# Patient Record
Sex: Male | Born: 1995 | Race: White | Hispanic: No | Marital: Single | Smoking: Never smoker
Health system: Southern US, Community
[De-identification: ages and names within clinical notes are randomized; demographics above are authoritative.]

## PROBLEM LIST (undated history)

## (undated) DIAGNOSIS — J45909 Unspecified asthma, uncomplicated: Secondary | ICD-10-CM

## (undated) DIAGNOSIS — T7840XA Allergy, unspecified, initial encounter: Secondary | ICD-10-CM

## (undated) HISTORY — DX: Unspecified asthma, uncomplicated: J45.909

## (undated) HISTORY — DX: Allergy, unspecified, initial encounter: T78.40XA

---
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
## 2016-02-16 ENCOUNTER — Encounter (HOSPITAL_COMMUNITY): Payer: Self-pay | Admitting: Emergency Medicine

## 2016-02-16 DIAGNOSIS — R1010 Upper abdominal pain, unspecified: Secondary | ICD-10-CM | POA: Insufficient documentation

## 2016-02-16 DIAGNOSIS — Z79899 Other long term (current) drug therapy: Secondary | ICD-10-CM | POA: Diagnosis not present

## 2016-02-16 DIAGNOSIS — R197 Diarrhea, unspecified: Secondary | ICD-10-CM | POA: Diagnosis present

## 2016-02-16 DIAGNOSIS — J45909 Unspecified asthma, uncomplicated: Secondary | ICD-10-CM | POA: Diagnosis not present

## 2016-02-16 DIAGNOSIS — E860 Dehydration: Secondary | ICD-10-CM | POA: Insufficient documentation

## 2016-02-16 LAB — CBC
HEMATOCRIT: 49.1 % (ref 39.0–52.0)
HEMOGLOBIN: 17.4 g/dL — AB (ref 13.0–17.0)
MCH: 33.4 pg (ref 26.0–34.0)
MCHC: 35.4 g/dL (ref 30.0–36.0)
MCV: 94.2 fL (ref 78.0–100.0)
PLATELETS: 286 10*3/uL (ref 150–400)
RBC: 5.21 MIL/uL (ref 4.22–5.81)
RDW: 12.7 % (ref 11.5–15.5)
WBC: 12.8 10*3/uL — AB (ref 4.0–10.5)

## 2016-02-16 MED ORDER — ONDANSETRON 4 MG PO TBDP
4.0000 mg | ORAL_TABLET | Freq: Once | ORAL | Status: AC | PRN
  Administered 2016-02-16: 4 mg via ORAL
  Filled 2016-02-16: qty 1

## 2016-02-16 MED ORDER — ONDANSETRON HCL 4 MG PO TABS
ORAL_TABLET | ORAL | Status: AC
  Filled 2016-02-16: qty 1

## 2016-02-16 NOTE — ED Triage Notes (Signed)
N/v/d and epigastric pain started today

## 2016-02-17 ENCOUNTER — Emergency Department (HOSPITAL_COMMUNITY)
Admission: EM | Admit: 2016-02-17 | Discharge: 2016-02-17 | Disposition: A | Discharge status: Home / Self Care | Payer: BLUE CROSS/BLUE SHIELD | Attending: Emergency Medicine | Admitting: Emergency Medicine

## 2016-02-17 DIAGNOSIS — R197 Diarrhea, unspecified: Secondary | ICD-10-CM

## 2016-02-17 DIAGNOSIS — E860 Dehydration: Secondary | ICD-10-CM

## 2016-02-17 DIAGNOSIS — R112 Nausea with vomiting, unspecified: Secondary | ICD-10-CM

## 2016-02-17 LAB — COMPREHENSIVE METABOLIC PANEL
ALK PHOS: 52 U/L (ref 38–126)
ALT: 19 U/L (ref 17–63)
AST: 21 U/L (ref 15–41)
Albumin: 4.9 g/dL (ref 3.5–5.0)
Anion gap: 10 (ref 5–15)
BILIRUBIN TOTAL: 1.3 mg/dL — AB (ref 0.3–1.2)
BUN: 17 mg/dL (ref 6–20)
CALCIUM: 9.3 mg/dL (ref 8.9–10.3)
CHLORIDE: 103 mmol/L (ref 101–111)
CO2: 23 mmol/L (ref 22–32)
CREATININE: 0.9 mg/dL (ref 0.61–1.24)
Glucose, Bld: 111 mg/dL — ABNORMAL HIGH (ref 65–99)
Potassium: 3.4 mmol/L — ABNORMAL LOW (ref 3.5–5.1)
Sodium: 136 mmol/L (ref 135–145)
TOTAL PROTEIN: 8.3 g/dL — AB (ref 6.5–8.1)

## 2016-02-17 LAB — LIPASE, BLOOD: LIPASE: 16 U/L (ref 11–51)

## 2016-02-17 LAB — URINALYSIS, ROUTINE W REFLEX MICROSCOPIC
BILIRUBIN URINE: NEGATIVE
GLUCOSE, UA: NEGATIVE mg/dL
HGB URINE DIPSTICK: NEGATIVE
Ketones, ur: >80 mg/dL — AB
Leukocytes, UA: NEGATIVE
Nitrite: NEGATIVE
PH: 6 (ref 5.0–8.0)
Protein, ur: NEGATIVE mg/dL
SPECIFIC GRAVITY, URINE: 1.02 (ref 1.005–1.030)

## 2016-02-17 MED ORDER — SODIUM CHLORIDE 0.9 % IV BOLUS (SEPSIS): 1000.0000 mL | Freq: Once | INTRAVENOUS | Status: DC

## 2016-02-17 MED ORDER — SODIUM CHLORIDE 0.9 % IV BOLUS (SEPSIS)
1000.0000 mL | Freq: Once | INTRAVENOUS | Status: AC
  Administered 2016-02-17: 1000 mL via INTRAVENOUS

## 2016-02-17 MED ORDER — ONDANSETRON 4 MG PO TBDP: 4.0000 mg | ORAL_TABLET | Freq: Three times a day (TID) | ORAL | 0 refills | Status: DC | PRN

## 2016-02-17 NOTE — Discharge Instructions (Signed)
Drink plenty of fluids (clear liquids) the next 12-24 hours then start a bland diet such as toast, crackers, jello, [employer] chicken noodle soup. Use the zofran for nausea or vomiting. Take imodium OTC for diarrhea. Avoid mild products until the diarrhea is gone. Recheck if you get worse again

## 2016-02-17 NOTE — ED Notes (Signed)
Pt tolerated fluid challenge with no vomiting reported or observed.

## 2016-02-17 NOTE — ED Notes (Signed)
Pt reports urinating moderate amount, yellow urine.

## 2016-02-17 NOTE — ED Provider Notes (Signed)
[facility]Y DEPT Provider Note   CSN: [id number] Arrival date & time: 02/16/16  2213  Time seen 03:33 AM   History   Chief Complaint Chief Complaint  Patient presents with  . Abdominal Pain    HPI Thomas Pollard is a [age] y.o. male.  HPI  patient states about 10 AM he had a "weird" feeling any stomach. He denies any pain or nausea. He states about noon he started having a epigastric aching sensation. About 5:30 PM he stated start having vomiting. He states he's vomited 5 times and had diarrhea once. He states what happens is he starts getting abdominal pain which he describes as cramping and it continues to get worse and then he has vomiting and the pain goes away until the cycle starts again. He denies any nausea now. He denies feeling dizziness or lightheadedness. Mother states she checked his temperature several times and his temperature varied from 100.4 up to 100.9. She states he was complaining of body aches. He denies sore throat, rhinorrhea, coughing. He has not been around anybody who is ill he is aware of.  PCP Dr Gerda DissLuking  Past Medical History:  Diagnosis Date  . Allergy   . Asthma     Patient Active Problem List   Diagnosis Date Noted  . Asthma, chronic 05/10/2014    History reviewed. No pertinent surgical history.     Home Medications    Prior to Admission medications   Medication Sig Start Date End Date Taking? Authorizing Provider  albuterol (PROVENTIL HFA;VENTOLIN HFA) 108 (90 BASE) MCG/ACT inhaler Inhale 2 puffs into the lungs every 6 (six) hours as needed for wheezing.    Historical Provider, MD  albuterol (PROVENTIL) (2.5 MG/3ML) 0.083% nebulizer solution USE 1 VIAL IN NEBULIZER EVERY 4-6 HOURS AS NEEDED. 12/21/12   Merlyn AlbertWilliam S Luking, MD  HYDROcodone-acetaminophen (NORCO/VICODIN) 5-325 MG per tablet Take 1 tablet by mouth every 4 (four) hours as needed for moderate pain. 05/24/13   Burgess AmorJulie Idol, PA-C  loratadine (CLARITIN) 10 MG tablet Take 10 mg by mouth  daily.    Historical Provider, MD  ondansetron (ZOFRAN ODT) 4 MG disintegrating tablet Take 1 tablet (4 mg total) by mouth every 8 (eight) hours as needed for nausea or vomiting. 02/17/16   Devoria AlbeIva Akirah Storck, MD    Family History Family History  Problem Relation Age of Onset  . Cancer Other     Social History Social History  Substance Use Topics  . Smoking status: Never Smoker  . Smokeless tobacco: Not on file  . Alcohol use Not on file  college student   Allergies   Augmentin [amoxicillin-pot clavulanate]   Review of Systems Review of Systems  All other systems reviewed and are negative.    Physical Exam Updated Vital Signs BP 125/83   Pulse 83   Temp 98.6 F (37 C) (Oral) Comment: Pt just rinsed mouth before triage was started.  Resp 17   Ht 5\' 8"  (1.727 m)   Wt 190 lb (86.2 kg)   SpO2 97%   BMI 28.89 kg/m   Vital signs normal    Physical Exam  Constitutional: He is oriented to person, place, and time. He appears well-developed and well-nourished.  Non-toxic appearance. He does not appear ill. No distress.  HENT:  Head: Normocephalic and atraumatic.  Right Ear: External ear normal.  Left Ear: External ear normal.  Nose: Nose normal. No mucosal edema or rhinorrhea.  Mouth/Throat: Mucous membranes are normal. No dental abscesses or uvula swelling.  Dry tongue  Eyes: Conjunctivae and EOM are normal. Pupils are equal, round, and reactive to light.  Neck: Normal range of motion and full passive range of motion without pain. Neck supple.  Cardiovascular: Normal rate, regular rhythm and normal heart sounds.  Exam reveals no gallop and no friction rub.   No murmur heard. Pulmonary/Chest: Effort normal and breath sounds normal. No respiratory distress. He has no wheezes. He has no rhonchi. He has no rales. He exhibits no tenderness and no crepitus.  Abdominal: Soft. Normal appearance and bowel sounds are normal. He exhibits no distension. There is no tenderness. There is no  rebound and no guarding.  His abdomen is nontender to palpation but he indicates when he got his pain is in the area between his umbilicus and his xiphoid.  Musculoskeletal: Normal range of motion. He exhibits no edema or tenderness.  Moves all extremities well.   Neurological: He is alert and oriented to person, place, and time. He has normal strength. No cranial nerve deficit.  Skin: Skin is warm, dry and intact. No rash noted. No erythema. There is pallor.  Psychiatric: He has a normal mood and affect. His speech is normal and behavior is normal. His mood appears not anxious.  Nursing note and vitals reviewed.    ED Treatments / Results  Labs (all labs ordered are listed, but only abnormal results are displayed) Results for orders placed or performed during the hospital encounter of 02/17/16  Lipase, blood  Result Value Ref Range   Lipase 16 11 - 51 U/L  Comprehensive metabolic panel  Result Value Ref Range   Sodium 136 135 - 145 mmol/L   Potassium 3.4 (L) 3.5 - 5.1 mmol/L   Chloride 103 101 - 111 mmol/L   CO2 23 22 - 32 mmol/L   Glucose, Bld 111 (H) 65 - 99 mg/dL   BUN 17 6 - 20 mg/dL   Creatinine, Ser 1.910.90 0.61 - 1.24 mg/dL   Calcium 9.3 8.9 - 47.810.3 mg/dL   Total Protein 8.3 (H) 6.5 - 8.1 g/dL   Albumin 4.9 3.5 - 5.0 g/dL   AST 21 15 - 41 U/L   ALT 19 17 - 63 U/L   Alkaline Phosphatase 52 38 - 126 U/L   Total Bilirubin 1.3 (H) 0.3 - 1.2 mg/dL   GFR calc non Af Amer >60 >60 mL/min   GFR calc Af Amer >60 >60 mL/min   Anion gap 10 5 - 15  CBC  Result Value Ref Range   WBC 12.8 (H) 4.0 - 10.5 K/uL   RBC 5.21 4.22 - 5.81 MIL/uL   Hemoglobin 17.4 (H) 13.0 - 17.0 g/dL   HCT 29.549.1 62.139.0 - 30.852.0 %   MCV 94.2 78.0 - 100.0 fL   MCH 33.4 26.0 - 34.0 pg   MCHC 35.4 30.0 - 36.0 g/dL   RDW 65.712.7 84.611.5 - 96.215.5 %   Platelets 286 150 - 400 K/uL  Urinalysis, Routine w reflex microscopic  Result Value Ref Range   Color, Urine YELLOW YELLOW   APPearance CLEAR CLEAR   Specific Gravity,  Urine 1.020 1.005 - 1.030   pH 6.0 5.0 - 8.0   Glucose, UA NEGATIVE NEGATIVE mg/dL   Hgb urine dipstick NEGATIVE NEGATIVE   Bilirubin Urine NEGATIVE NEGATIVE   Ketones, ur >80 (A) NEGATIVE mg/dL   Protein, ur NEGATIVE NEGATIVE mg/dL   Nitrite NEGATIVE NEGATIVE   Leukocytes, UA NEGATIVE NEGATIVE   Laboratory interpretation all normal except Concentrated hemoglobin consistent  with dehydration, ketones in urine consistent with dehydration, mild hypokalemia consistent with history of vomiting, leukocytosis    EKG  EKG Interpretation None       Radiology No results found.  Procedures Procedures (including critical care time)  Medications Ordered in ED Medications  ondansetron (ZOFRAN) 4 MG tablet (  Not Given 02/16/16 2229)  ondansetron (ZOFRAN-ODT) disintegrating tablet 4 mg (4 mg Oral Given 02/16/16 2228)  sodium chloride 0.9 % bolus 1,000 mL (0 mLs Intravenous Stopped 02/17/16 0449)  sodium chloride 0.9 % bolus 1,000 mL (0 mLs Intravenous Stopped 02/17/16 0351)     Initial Impression / Assessment and Plan / ED Course  I have reviewed the triage vital signs and the nursing notes.  Pertinent labs & imaging results that were available during my care of the patient were reviewed by me and considered in my medical decision making (see chart for details).  Clinical Course    Patient was given Zofran, he was given 2 L IV fluids.  4 AM patient is feeling better. He wants to try oral fluids.  Recheck 5:10 AM patient is feeling better, he is tolerated oral fluids. He however has not had any urinary output since he got the IV fluids and has no urge to urinate. Third IV was ordered.  When nurse went to hang the third bag patient states he just gone to the bathroom and urinated a moderate amount. He was discharged home.  Final Clinical Impressions(s) / ED Diagnoses   Final diagnoses:  Nausea vomiting and diarrhea  Dehydration    New Prescriptions Discharge Medication List as  of 02/17/2016  5:43 AM    START taking these medications   Details  ondansetron (ZOFRAN ODT) 4 MG disintegrating tablet Take 1 tablet (4 mg total) by mouth every 8 (eight) hours as needed for nausea or vomiting., Starting Tue 02/17/2016, Print        Plan discharge  Devoria Albe, MD, Concha Pyo, MD 02/17/16 [phone]

## 2016-02-17 NOTE — ED Notes (Signed)
Pt given gingerale for fluid challenge. 

## 2016-03-29 DIAGNOSIS — Z23 Encounter for immunization: Secondary | ICD-10-CM | POA: Diagnosis not present

## 2016-06-03 ENCOUNTER — Ambulatory Visit (INDEPENDENT_AMBULATORY_CARE_PROVIDER_SITE_OTHER): Payer: 59 | Admitting: Nurse Practitioner

## 2016-06-03 ENCOUNTER — Encounter: Payer: Self-pay | Admitting: Nurse Practitioner

## 2016-06-03 VITALS — BP 112/78 | Ht 68.0 in | Wt 201.1 lb

## 2016-06-03 DIAGNOSIS — G43009 Migraine without aura, not intractable, without status migrainosus: Secondary | ICD-10-CM | POA: Diagnosis not present

## 2016-06-03 MED ORDER — TOPIRAMATE 50 MG PO TABS: ORAL_TABLET | ORAL | 0 refills | Status: AC

## 2016-06-03 MED ORDER — RIZATRIPTAN BENZOATE 10 MG PO TBDP: 10.0000 mg | ORAL_TABLET | ORAL | 0 refills | Status: AC | PRN

## 2016-06-03 NOTE — Patient Instructions (Signed)
Migraine Headache A migraine headache is an intense, throbbing pain on one side or both sides of the head. Migraines may also cause other symptoms, such as nausea, vomiting, and sensitivity to light and noise. What are the causes? Doing or taking certain things may also trigger migraines, such as:  Alcohol.  Smoking.  Medicines, such as: ? Medicine used to treat chest pain (nitroglycerine). ? Birth control pills. ? Estrogen pills. ? Certain blood pressure medicines.  Aged cheeses, chocolate, or caffeine.  Foods or drinks that contain nitrates, glutamate, aspartame, or tyramine.  Physical activity.  Other things that may trigger a migraine include:  Menstruation.  Pregnancy.  Hunger.  Stress, lack of sleep, too much sleep, or fatigue.  Weather changes.  What increases the risk? The following factors may make you more likely to experience migraine headaches:  Age. Risk increases with age.  Family history of migraine headaches.  Being Caucasian.  Depression and anxiety.  Obesity.  Being a woman.  Having a hole in the heart (patent foramen ovale) or other heart problems.  What are the signs or symptoms? The main symptom of this condition is pulsating or throbbing pain. Pain may:  Happen in any area of the head, such as on one side or both sides.  Interfere with daily activities.  Get worse with physical activity.  Get worse with exposure to bright lights or loud noises.  Other symptoms may include:  Nausea.  Vomiting.  Dizziness.  General sensitivity to bright lights, loud noises, or smells.  Before you get a migraine, you may get warning signs that a migraine is developing (aura). An aura may include:  Seeing flashing lights or having blind spots.  Seeing bright spots, halos, or zigzag lines.  Having tunnel vision or blurred vision.  Having numbness or a tingling feeling.  Having trouble talking.  Having muscle weakness.  How is this  diagnosed? A migraine headache can be diagnosed based on:  Your symptoms.  A physical exam.  Tests, such as CT scan or MRI of the head. These imaging tests can help rule out other causes of headaches.  Taking fluid from the spine (lumbar puncture) and analyzing it (cerebrospinal fluid analysis, or CSF analysis).  How is this treated? A migraine headache is usually treated with medicines that:  Relieve pain.  Relieve nausea.  Prevent migraines from coming back.  Treatment may also include:  Acupuncture.  Lifestyle changes like avoiding foods that trigger migraines.  Follow these instructions at home: Medicines  Take over-the-counter and prescription medicines only as told by your health care provider.  Do not drive or use heavy machinery while taking prescription pain medicine.  To prevent or treat constipation while you are taking prescription pain medicine, your health care provider may recommend that you: ? Drink enough fluid to keep your urine clear or pale yellow. ? Take over-the-counter or prescription medicines. ? Eat foods that are high in fiber, such as fresh fruits and vegetables, whole grains, and beans. ? Limit foods that are high in fat and processed sugars, such as fried and sweet foods. Lifestyle  Avoid alcohol use.  Do not use any products that contain nicotine or tobacco, such as cigarettes and e-cigarettes. If you need help quitting, ask your health care provider.  Get at least 8 hours of sleep every night.  Limit your stress. General instructions   Keep a journal to find out what may trigger your migraine headaches. For example, write down: ? What you eat and   drink. ? How much sleep you get. ? Any change to your diet or medicines.  If you have a migraine: ? Avoid things that make your symptoms worse, such as bright lights. ? It may help to lie down in a dark, quiet room. ? Do not drive or use heavy machinery. ? Ask your health care provider  what activities are safe for you while you are experiencing symptoms.  Keep all follow-up visits as told by your health care provider. This is important. Contact a health care provider if:  You develop symptoms that are different or more severe than your usual migraine symptoms. Get help right away if:  Your migraine becomes severe.  You have a fever.  You have a stiff neck.  You have vision loss.  Your muscles feel weak or like you cannot control them.  You start to lose your balance often.  You develop trouble walking.  You faint. This information is not intended to replace advice given to you by your health care provider. Make sure you discuss any questions you have with your health care provider. Document Released: 03/01/2005 Document Revised: 09/19/2015 Document Reviewed: 08/18/2015 Elsevier Interactive Patient Education  2017 [employer]   

## 2016-06-04 ENCOUNTER — Encounter: Payer: Self-pay | Admitting: Nurse Practitioner

## 2016-06-04 DIAGNOSIS — G43009 Migraine without aura, not intractable, without status migrainosus: Secondary | ICD-10-CM | POA: Insufficient documentation

## 2016-06-04 NOTE — Progress Notes (Signed)
Subjective:  Presents with his mother for c/o headaches x 2 months. Offered to interview patient alone but he defers. Describes mild to severe headaches. Having a severe headache as least once a week. Starts around the eye area and moves as a wave to the occipital area. Always has eye pain either before or during the headache. Pulsatile. No visual changes. No N/V. Worse this week. Does not wake him up. Lasts 45 minutes to 4-5 hours. Occasional photosensitivity. Minimal phonophobia. No difficulty speaking or swallowing. No numbness or weakness of face, arms or legs. No identified triggers. Has been under increased stress. Slight relief with OTC analgesics. Denies overuse. Minimal caffeine intake. Eye exam last year; another scheduled next month. Also has history of panic attacks about one per month. Has had 4 this month. Stress with completing school. In a relationship which he states is not going well. Does not give more details. Seeing a therapist; next appointment 3/26. Diagnosed with depression. Not on medication but has appointment with psychiatrist on 5/2. Denies alcohol or drug use. Denies suicidal or homicidal thoughts or ideation at this time. FMH: parents and brother have migraines. Denies URI symptoms.   Objective:   BP 112/78   Ht 5\' 8"  (1.727 m)   Wt 201 lb 2 oz (91.2 kg)   BMI 30.58 kg/m  NAD. Alert, oriented. Calm affect. TMs mild clear effusion. Pharynx clear. Neck supple with mild anterior adenopathy. Lungs clear. Heart RRR. Pupils equal and reactive to light. EOMs intact without nystagmus. Point to point localization normal. Hand strength 5+ bilat. Patellar reflexes normal. Gait normal. Romberg neg.   Assessment:   Problem List Items Addressed This Visit      Cardiovascular and Mediastinum   Migraine without aura and without status migrainosus, not intractable - Primary   Relevant Medications   topiramate (TOPAMAX) 50 MG tablet   rizatriptan (MAXALT-MLT) 10 MG disintegrating tablet        Plan:   Meds ordered this encounter  Medications  . topiramate (TOPAMAX) 50 MG tablet    Sig: One po qhs    Dispense:  30 tablet    Refill:  0    Order Specific Question:   Supervising Provider    Answer:   Merlyn AlbertLUKING, WILLIAM S [[id number]]  . rizatriptan (MAXALT-MLT) 10 MG disintegrating tablet    Sig: Take 1 tablet (10 mg total) by mouth as needed for migraine. May repeat in 2 hours if needed; max 2 per 24 hrs    Dispense:  10 tablet    Refill:  0    Order Specific Question:   Supervising Provider    Answer:   Merlyn AlbertLUKING, WILLIAM S [[id number]]   Discussed options. Discussed importance of stress reduction. Keep headache log on app and bring to next visit. Warning signs reviewed. Recheck in 3-4 weeks, sooner if any problems. Continue follow up with mental health. Seek help immediately if suicidal or homicidal.  Over 50% of this visit was spent in consultation.

## 2016-06-28 ENCOUNTER — Ambulatory Visit: Payer: 59 | Admitting: Nurse Practitioner

## 2016-06-28 ENCOUNTER — Encounter: Payer: Self-pay | Admitting: Family Medicine

## 2016-07-08 ENCOUNTER — Encounter: Payer: Self-pay | Admitting: Family Medicine

## 2016-07-08 ENCOUNTER — Ambulatory Visit: Payer: 59 | Admitting: Nurse Practitioner

## 2016-11-29 DIAGNOSIS — L219 Seborrheic dermatitis, unspecified: Secondary | ICD-10-CM | POA: Diagnosis not present

## 2016-11-29 DIAGNOSIS — Z23 Encounter for immunization: Secondary | ICD-10-CM | POA: Diagnosis not present

## 2017-08-02 DIAGNOSIS — Z4801 Encounter for change or removal of surgical wound dressing: Secondary | ICD-10-CM | POA: Diagnosis not present

## 2017-12-17 DIAGNOSIS — Z23 Encounter for immunization: Secondary | ICD-10-CM | POA: Diagnosis not present

## 2019-04-11 ENCOUNTER — Encounter: Payer: Self-pay | Admitting: Family Medicine

## 2019-04-12 ENCOUNTER — Encounter: Payer: Self-pay | Admitting: Family Medicine
